# Patient Record
Sex: Female | Born: 1995 | Hispanic: No | Marital: Single | Smoking: Never smoker
Health system: Southern US, Community
[De-identification: ages and names within clinical notes are randomized; demographics above are authoritative.]

## PROBLEM LIST (undated history)

## (undated) ENCOUNTER — Inpatient Hospital Stay (HOSPITAL_COMMUNITY): Payer: Self-pay

## (undated) DIAGNOSIS — N83209 Unspecified ovarian cyst, unspecified side: Secondary | ICD-10-CM

## (undated) DIAGNOSIS — Z789 Other specified health status: Secondary | ICD-10-CM

## (undated) HISTORY — PX: WISDOM TOOTH EXTRACTION: SHX21

---
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
## 2014-06-10 ENCOUNTER — Encounter (HOSPITAL_COMMUNITY): Payer: Self-pay | Admitting: *Deleted

## 2014-07-25 ENCOUNTER — Encounter (HOSPITAL_COMMUNITY): Payer: Self-pay | Admitting: *Deleted

## 2014-09-29 IMAGING — US US OB TRANSVAGINAL
1 series · 14 of 28 positions shown · non-contrast
Comparison: None.

CLINICAL DATA: 17-year-old pregnant female with pelvic pain,
cramping and spotting. Estimated gestational age of 6 weeks 0 days
by LMP.

Quantitative beta HCG of 5,360
EXAM:
OBSTETRIC <14 WK US AND TRANSVAGINAL OB US
TECHNIQUE: Both transabdominal and transvaginal ultrasound examinations were
performed for complete evaluation of the gestation as well as the
maternal uterus, adnexal regions, and pelvic cul-de-sac.
Transvaginal technique was performed to assess early pregnancy.

[Series 1: us ob comp less 14 wks · 49 acquisitions, 14 frames shown]
[im 2/49]
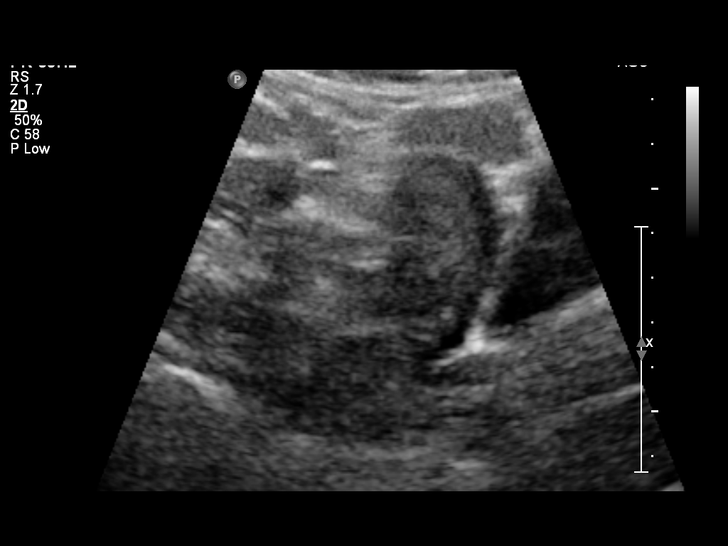
[im 6/49]
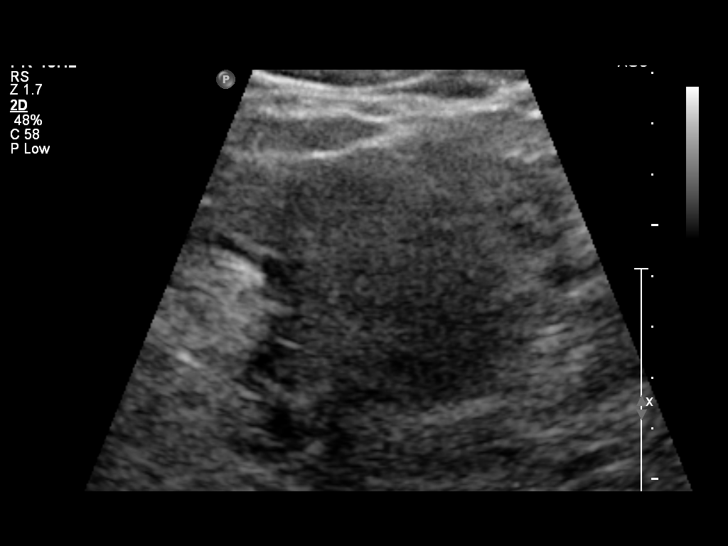
[im 9/49]
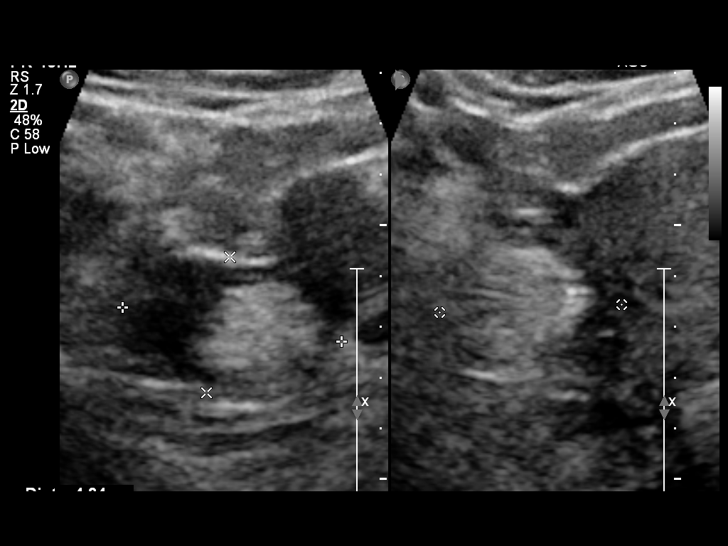
[im 13/49]
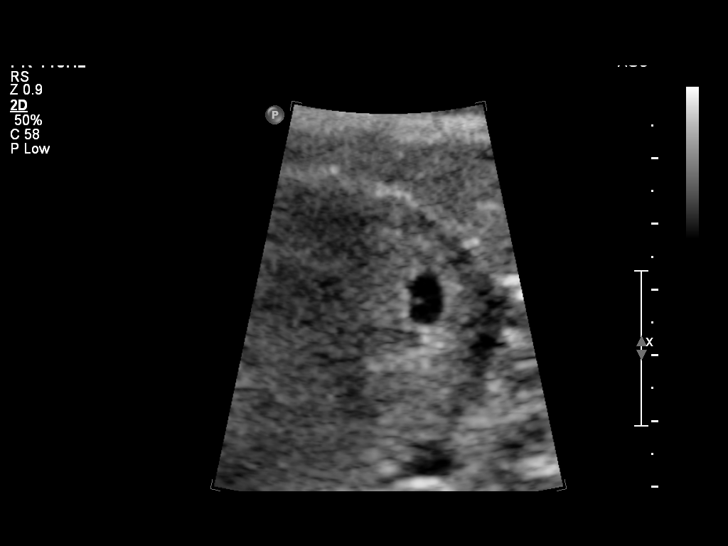
[im 17/49]
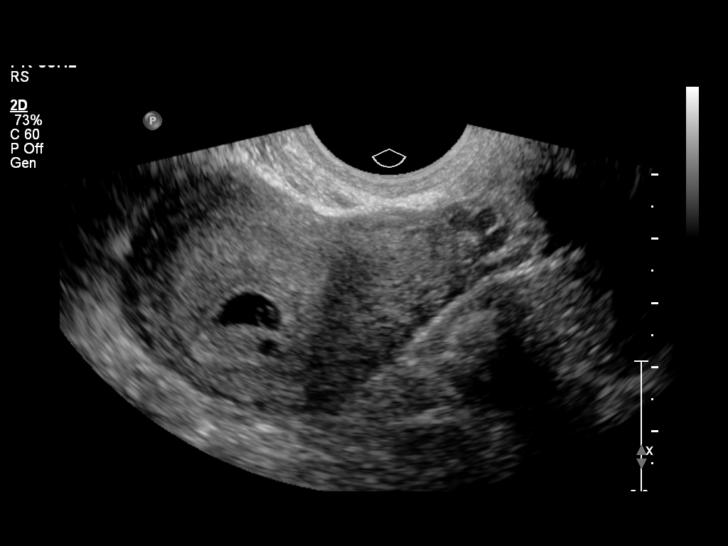
[im 20/49]
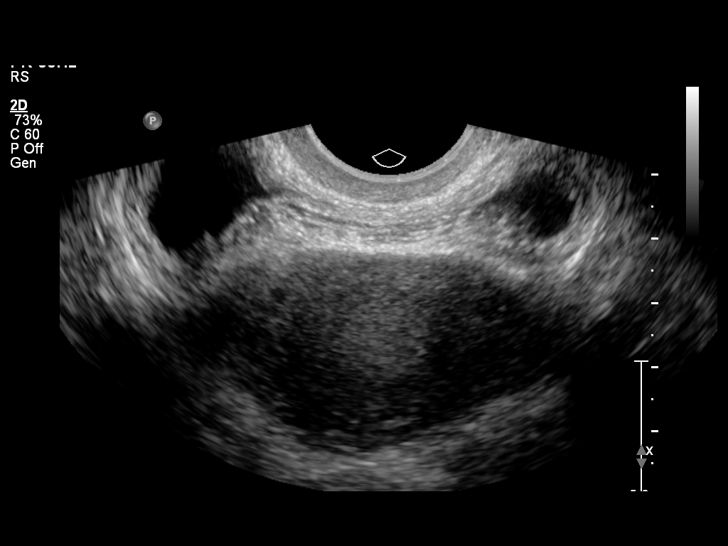
[im 24/49]
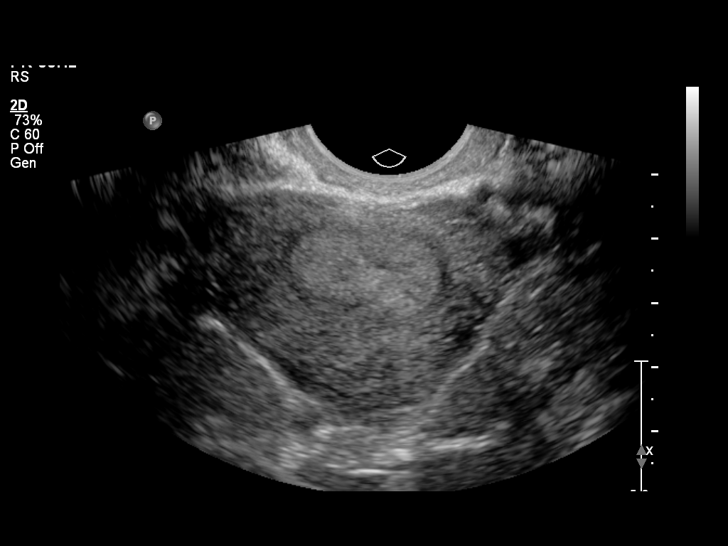
[im 27/49]
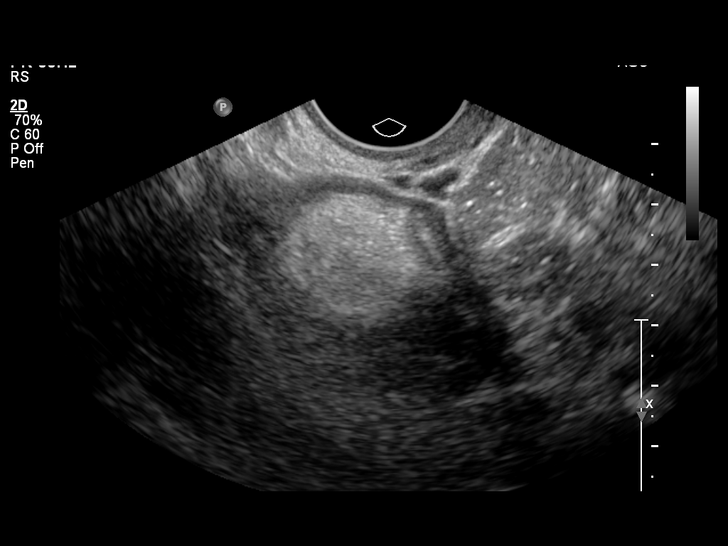
[im 31/49]
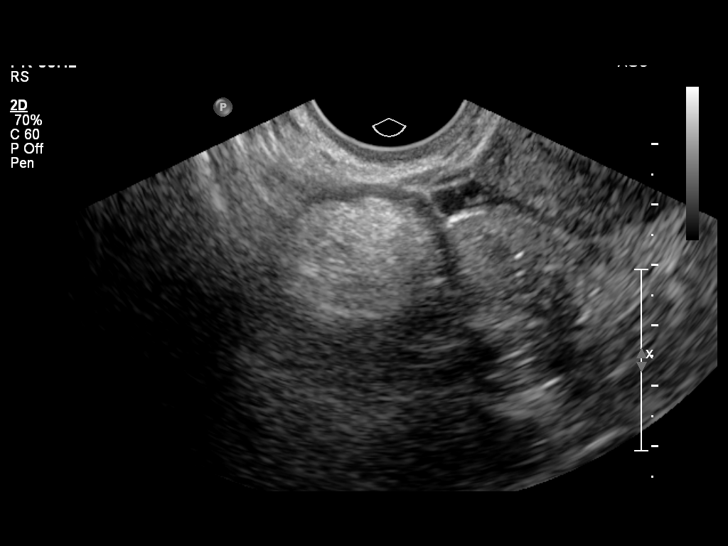
[im 34/49]
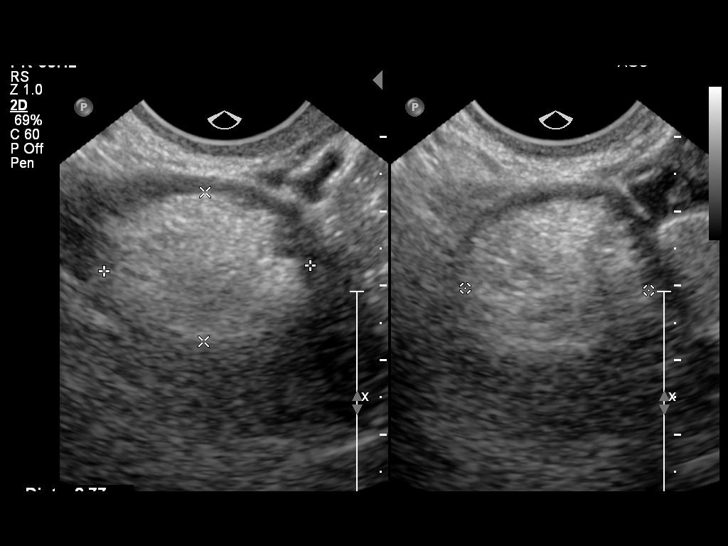
[im 38/49]
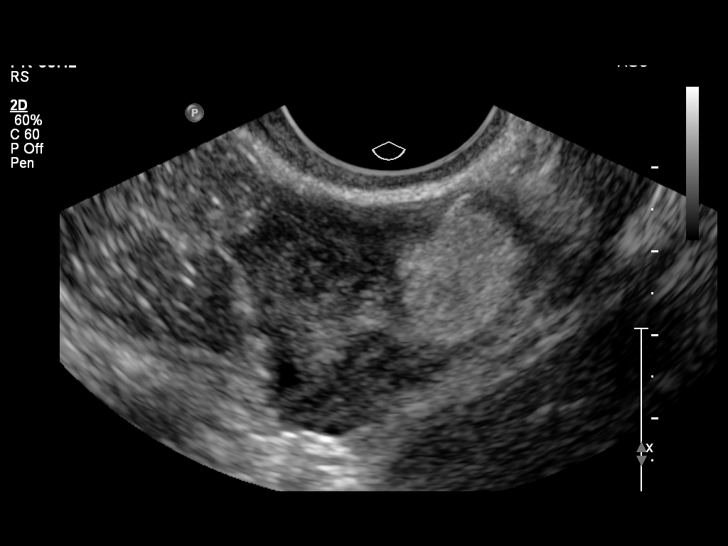
[im 41/49]
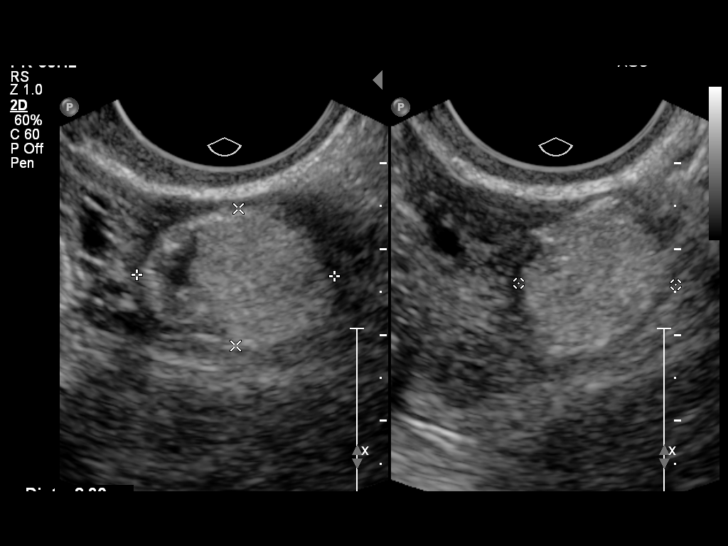
[im 45/49]
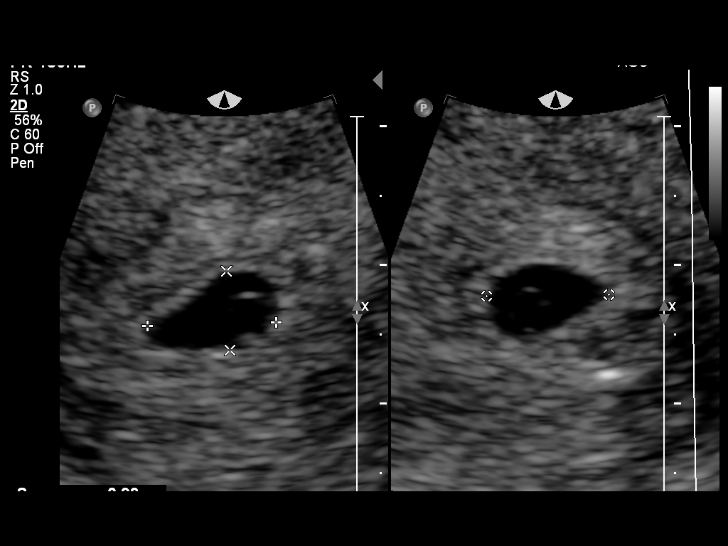
[im 49/49]
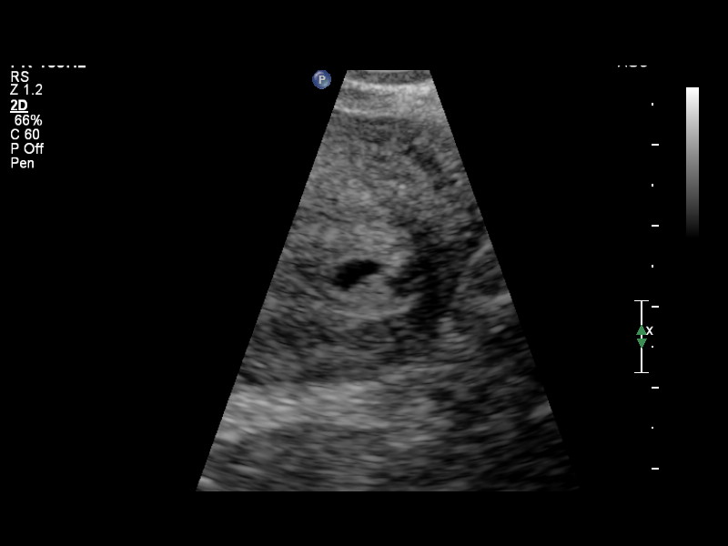

[14 of 28 positions shown; findings below may reference images not displayed]

FINDINGS: Intrauterine gestational sac: Visualized/normal in shape.

Yolk sac:  Visualized

Embryo:  Not visualized

Cardiac Activity: Not visualized

MSD:  7.9  mm   5 w   3  d           US EDC: 04/04/2014

Maternal uterus/adnexae: A small subchorionic hemorrhage is noted.

A 2.8 x 2 x 2.5 cm right ovarian dermoid is present.

A 2.3 x 1.6 x 1.8 cm left ovarian dermoid is present.

There is no evidence of free fluid or other adnexal mass.
IMPRESSION: Single intrauterine gestational sac with yolk sac. Fetal pole not
identified at this time. Estimated gestational age of 5 weeks 3 days
by this ultrasound.

Small subchorionic hemorrhage.

Bilateral ovarian dermoids.

## 2016-10-18 ENCOUNTER — Emergency Department (HOSPITAL_COMMUNITY): Payer: Self-pay

## 2016-10-18 ENCOUNTER — Encounter (HOSPITAL_COMMUNITY): Payer: Self-pay | Admitting: *Deleted

## 2016-10-18 ENCOUNTER — Emergency Department (HOSPITAL_COMMUNITY)
Admission: EM | Admit: 2016-10-18 | Discharge: 2016-10-18 | Disposition: A | Discharge status: Home / Self Care | Payer: Self-pay | Attending: Emergency Medicine | Admitting: Emergency Medicine

## 2016-10-18 DIAGNOSIS — N939 Abnormal uterine and vaginal bleeding, unspecified: Secondary | ICD-10-CM

## 2016-10-18 DIAGNOSIS — Z3A09 9 weeks gestation of pregnancy: Secondary | ICD-10-CM | POA: Insufficient documentation

## 2016-10-18 DIAGNOSIS — O23591 Infection of other part of genital tract in pregnancy, first trimester: Secondary | ICD-10-CM | POA: Insufficient documentation

## 2016-10-18 DIAGNOSIS — O0281 Inappropriate change in quantitative human chorionic gonadotropin (hCG) in early pregnancy: Secondary | ICD-10-CM | POA: Insufficient documentation

## 2016-10-18 DIAGNOSIS — N760 Acute vaginitis: Secondary | ICD-10-CM

## 2016-10-18 DIAGNOSIS — B9689 Other specified bacterial agents as the cause of diseases classified elsewhere: Secondary | ICD-10-CM

## 2016-10-18 HISTORY — DX: Unspecified ovarian cyst, unspecified side: N83.209

## 2016-10-18 LAB — WET PREP, GENITAL
SPERM: NONE SEEN
Trich, Wet Prep: NONE SEEN
YEAST WET PREP: NONE SEEN

## 2016-10-18 LAB — CBC WITH DIFFERENTIAL/PLATELET
Basophils Absolute: 0 10*3/uL (ref 0.0–0.1)
Basophils Relative: 0 %
EOS PCT: 0 %
Eosinophils Absolute: 0 10*3/uL (ref 0.0–0.7)
HEMATOCRIT: 36.5 % (ref 36.0–46.0)
HEMOGLOBIN: 12.9 g/dL (ref 12.0–15.0)
LYMPHS ABS: 1.9 10*3/uL (ref 0.7–4.0)
LYMPHS PCT: 33 %
MCH: 29.2 pg (ref 26.0–34.0)
MCHC: 35.3 g/dL (ref 30.0–36.0)
MCV: 82.6 fL (ref 78.0–100.0)
Monocytes Absolute: 0.5 10*3/uL (ref 0.1–1.0)
Monocytes Relative: 9 %
NEUTROS ABS: 3.3 10*3/uL (ref 1.7–7.7)
Neutrophils Relative %: 58 %
PLATELETS: 211 10*3/uL (ref 150–400)
RBC: 4.42 MIL/uL (ref 3.87–5.11)
RDW: 12.3 % (ref 11.5–15.5)
WBC: 5.8 10*3/uL (ref 4.0–10.5)

## 2016-10-18 LAB — URINALYSIS, ROUTINE W REFLEX MICROSCOPIC
Bilirubin Urine: NEGATIVE
GLUCOSE, UA: NEGATIVE mg/dL
HGB URINE DIPSTICK: NEGATIVE
Ketones, ur: 20 mg/dL — AB
NITRITE: NEGATIVE
PROTEIN: NEGATIVE mg/dL
SPECIFIC GRAVITY, URINE: 1.03 (ref 1.005–1.030)
pH: 6 (ref 5.0–8.0)

## 2016-10-18 LAB — COMPREHENSIVE METABOLIC PANEL
ALK PHOS: 50 U/L (ref 38–126)
ALT: 15 U/L (ref 14–54)
AST: 17 U/L (ref 15–41)
Albumin: 3.7 g/dL (ref 3.5–5.0)
Anion gap: 8 (ref 5–15)
BILIRUBIN TOTAL: 0.3 mg/dL (ref 0.3–1.2)
BUN: 6 mg/dL (ref 6–20)
CALCIUM: 9.2 mg/dL (ref 8.9–10.3)
CO2: 22 mmol/L (ref 22–32)
CREATININE: 0.5 mg/dL (ref 0.44–1.00)
Chloride: 107 mmol/L (ref 101–111)
Glucose, Bld: 84 mg/dL (ref 65–99)
Potassium: 3.5 mmol/L (ref 3.5–5.1)
Sodium: 137 mmol/L (ref 135–145)
Total Protein: 6.6 g/dL (ref 6.5–8.1)

## 2016-10-18 LAB — HCG, QUANTITATIVE, PREGNANCY: hCG, Beta Chain, Quant, S: 212918 m[IU]/mL — ABNORMAL HIGH (ref <5)

## 2016-10-18 MED ORDER — METRONIDAZOLE 500 MG PO TABS: 500.0000 mg | ORAL_TABLET | Freq: Two times a day (BID) | ORAL | 0 refills | Status: AC

## 2016-10-18 NOTE — ED Triage Notes (Signed)
Pt reports being approx [redacted] weeks pregnant and having spotting. Had similar episode 2 weeks ago. Also has foul odor and hx of BV

## 2016-10-18 NOTE — Discharge Instructions (Addendum)
Please follow up with the women's clinic on Monday for follow up.  Your ultrasound today showed: Single live 9 week 5 day intrauterine gestation with ultrasound EDC  of 05/18/2017. Small right-sided subchorionic hemorrhage. Stable  echogenic foci within the ovaries consistent with previously  described small dermoids.

## 2016-10-18 NOTE — ED Notes (Signed)
Pt to ultrasound

## 2016-10-18 NOTE — ED Provider Notes (Signed)
[facility]-EMERGENCY DEPT Provider Note   CSN: [id number] Arrival date & time: 10/18/16  1316     History   Chief Complaint Chief Complaint  Patient presents with  . Vaginal Bleeding  . Vaginal Discharge    HPI Taylor Webb is a 21 y.o. female presenting with vaginal spotting which she states only occurs when she wipes but is not enough to fill a pad or anything. She has had a little bit of spotting 2 weeks ago as well. She just recently moved to the area and doesn't have an OB/GYN. She was last seen January 5th  checked her hCG levels which were half of what they were supposed to be. She had an ultrasound on December 31st which showed an intrauterine sac but no pole. She also endorses a foul vaginal smell, nausea, vomiting and states that she has not been able to keep anything down for the last 2 weeks. She denies any abdominal pain, shortness of breath, chest pain or any other symptoms.  HPI  Past Medical History:  Diagnosis Date  . Medical history non-contributory   . Ovarian cyst     Patient Active Problem List   Diagnosis Date Noted  . Headache(784.0) 09/08/2013    Past Surgical History:  Procedure Laterality Date  . WISDOM TOOTH EXTRACTION      OB History    Gravida Para Term Preterm AB Living   2             SAB TAB Ectopic Multiple Live Births                   Home Medications    Prior to Admission medications   Medication Sig Start Date End Date Taking? Authorizing Provider  acetaminophen-codeine (TYLENOL #3) 300-30 MG per tablet Take 1-2 tablets by mouth every 6 (six) hours as needed for moderate pain. Patient not taking: Reported on 10/18/2016 10/02/13   Delbert Phenix, NP  azithromycin (ZITHROMAX Z-PAK) 250 MG tablet Take 1 tablet (250 mg total) by mouth daily. Patient not taking: Reported on 10/18/2016 10/02/13   Delbert Phenix, NP  oseltamivir (TAMIFLU) 75 MG capsule Take 1 capsule (75 mg total) by mouth every 12 (twelve) hours. Patient not taking:  Reported on 10/18/2016 10/02/13   Delbert Phenix, NP    Family History Family History  Problem Relation Age of Onset  . Depression Mother   . Hypertension Mother   . Bipolar disorder Mother   . Asthma Brother   . Asthma Daughter   . Arthritis Maternal Grandmother   . Arthritis Maternal Grandfather   . Bipolar disorder Sister     Social History Social History  Substance Use Topics  . Smoking status: Never Smoker  . Smokeless tobacco: Never Used  . Alcohol use No     Allergies   Pineapple   Review of Systems Review of Systems  Constitutional: Negative for chills and fever.  HENT: Negative for ear pain and sore throat.   Eyes: Negative for pain and visual disturbance.  Respiratory: Negative for cough, chest tightness, shortness of breath, wheezing and stridor.   Cardiovascular: Negative for chest pain, palpitations and leg swelling.  Gastrointestinal: Positive for nausea and vomiting. Negative for abdominal distention, abdominal pain, blood in stool and diarrhea.  Genitourinary: Positive for vaginal bleeding. Negative for difficulty urinating, dysuria, flank pain, hematuria, pelvic pain, vaginal discharge and vaginal pain.       Spotting with streaks of dark blood only when wiping  Musculoskeletal:  Negative for arthralgias, back pain, gait problem, myalgias, neck pain and neck stiffness.  Skin: Negative for color change, pallor and rash.  Neurological: Negative for seizures and syncope.  All other systems reviewed and are negative.    Physical Exam Updated Vital Signs BP 128/66   Pulse 75   Temp 97.8 F (36.6 C) (Oral)   Resp 18   LMP 08/07/2016   SpO2 100%   Physical Exam  Constitutional: She appears well-developed and well-nourished. No distress.  Afebrile, nontoxic-appearing, sitting comfortably in bed in no acute distress.  HENT:  Head: Normocephalic and atraumatic.  Eyes: Conjunctivae and EOM are normal.  Neck: Normal range of motion. Neck supple.    Cardiovascular: Normal rate, regular rhythm, normal heart sounds and intact distal pulses.   No murmur heard. Pulmonary/Chest: Effort normal and breath sounds normal. No respiratory distress.  Abdominal: Soft. Bowel sounds are normal. She exhibits no distension and no mass. There is no tenderness. There is no rebound and no guarding.  Genitourinary: Vaginal discharge found.  Genitourinary Comments: No adnexal tenderness or cervical motion tenderness. Small amount of dark blood from the os. Os is closed  Musculoskeletal: She exhibits no edema or tenderness.  Neurological: She is alert.  Skin: Skin is warm and dry. No rash noted. She is not diaphoretic. No pallor.  Psychiatric: She has a normal mood and affect. Her behavior is normal.  Nursing note and vitals reviewed.    ED Treatments / Results  Labs (all labs ordered are listed, but only abnormal results are displayed) Labs Reviewed  HCG, QUANTITATIVE, PREGNANCY - Abnormal; Notable for the following:       Result Value   hCG, Beta Chain, Quant, S 212,918 (*)    All other components within normal limits  URINALYSIS, ROUTINE W REFLEX MICROSCOPIC - Abnormal; Notable for the following:    APPearance HAZY (*)    Ketones, ur 20 (*)    Leukocytes, UA TRACE (*)    Bacteria, UA RARE (*)    Squamous Epithelial / LPF 0-5 (*)    All other components within normal limits  WET PREP, GENITAL  CBC WITH DIFFERENTIAL/PLATELET  COMPREHENSIVE METABOLIC PANEL  RPR  HIV ANTIBODY (ROUTINE TESTING)  GC/CHLAMYDIA PROBE AMP ([place]) NOT AT [facility]    EKG  EKG Interpretation None       Radiology US Ob Comp Less 14 Wks  Result Date: 10/18/2016 CLINICAL DATA:  Vaginal bleeding EXAM: OBSTETRIC <14 WK [place] AND TRANSVAGINAL OB US TECHNIQUE: Both transabdominal and transvaginal ultrasound examinations were performed for complete evaluation of the gestation as well as the maternal uterus, adnexal regions, and pelvic cul-de-sac. Transvaginal technique  was performed to assess early pregnancy. COMPARISON:  None for this pregnancy FINDINGS: Intrauterine gestational sac: Single Yolk sac:  Visualized. Embryo:  Visualized. Cardiac Activity: Visualized. Heart Rate: 171  bpm CRL:  29  mm   9 w   5 d                  [place] EDC: 05/18/2017 Subchorionic hemorrhage:  Small noted on the right Maternal uterus/adnexae: Nonacute. Areas of increased echogenicity within both ovaries consistent with dermoids are without significant change. IMPRESSION: Single live 9 week 5 day intrauterine gestation with ultrasound EDC of 05/18/2017. Small right-sided subchorionic hemorrhage. Stable echogenic foci within the ovaries consistent with previously described small dermoids. Electronically Signed   By: Tollie Eth M.D.   On: 10/18/2016 18:25   US Ob Transvaginal  Result Date: 10/18/2016  CLINICAL DATA:  Vaginal bleeding EXAM: OBSTETRIC <14 WK US AND TRANSVAGINAL OB US TECHNIQUE: Both transabdominal and transvaginal ultrasound examinations were performed for complete evaluation of the gestation as well as the maternal uterus, adnexal regions, and pelvic cul-de-sac. Transvaginal technique was performed to assess early pregnancy. COMPARISON:  None for this pregnancy FINDINGS: Intrauterine gestational sac: Single Yolk sac:  Visualized. Embryo:  Visualized. Cardiac Activity: Visualized. Heart Rate: 171  bpm CRL:  29  mm   9 w   5 d                  US EDC: 05/18/2017 Subchorionic hemorrhage:  Small noted on the right Maternal uterus/adnexae: Nonacute. Areas of increased echogenicity within both ovaries consistent with dermoids are without significant change. IMPRESSION: Single live 9 week 5 day intrauterine gestation with ultrasound EDC of 05/18/2017. Small right-sided subchorionic hemorrhage. Stable echogenic foci within the ovaries consistent with previously described small dermoids. Electronically Signed   By: Tollie Ethavid  Kwon M.D.   On: 10/18/2016 18:25    Procedures Procedures (including  critical care time)  Medications Ordered in ED Medications - No data to display   Initial Impression / Assessment and Plan / ED Course  I have reviewed the triage vital signs and the nursing notes.  Pertinent labs & imaging results that were available during my care of the patient were reviewed by me and considered in my medical decision making (see chart for details).    21 y/o otherwise healthy presenting with vaginal spotting in first trimester. Patient does not have an OB/Gyn and states that she had an ultrasound on December 31st showing an intrauterine gestational sac but too early for fetal pole visualization. LMP 08/07/2016  U/s here showing fetal pole and cardiac activity. 9 weeks and 5 days. EDD 05/18/2017 Pelvic exam showed closed os, small amount of dark blood from the os and thick vaginal discharge and odor. Overall exam otherwise unremarkable.  Discussed with patient need to follow up with the women's clinic on Monday and thereafter transfer her prenatal care to her OB/Gyn once insurance is effective. Emphasized the importance of prenatal care. She understood and agreed with plan.  Discussed strict return precautions. Patient was advised to return to the emergency department if experiencing any worsening of symptoms. She understood instructions and agreed with discharge plan.  Transferred patient care at end of shift to Lajuana RippleShari Upstil, PA-C pending wet prep results. Anticipate discharge home +/- script.  Patient was discussed with Dr. Donnald GarrePfeiffer who agrees with assessment and plan.  Final Clinical Impressions(s) / ED Diagnoses   Final diagnoses:  Vaginal spotting    New Prescriptions New Prescriptions   No medications on file     Gregary CromerJessica B Tanikka Bresnan, PA-C 10/18/16 2112    Arby BarretteMarcy Pfeiffer, MD 10/21/16 [phone]

## 2016-10-18 NOTE — ED Provider Notes (Signed)
Patient signed out at end of shift by Mathews RobinsonsJessica Mitchell, PA-C, with wet prep pending. She presented for vaginal spotting in the setting of known pregnancy. She had a previous US confirming IUP, per patient, but was concerned about recent vaginal spotting.   Wet prep shows BV which will be treated with Flagyl. [facility]. Return precautions were reviewed.    Elpidio AnisShari Keelon Zurn, PA-C 10/18/16 2217    Arby BarretteMarcy Pfeiffer, MD 10/21/16 [phone]

## 2016-10-19 LAB — HIV ANTIBODY (ROUTINE TESTING W REFLEX): HIV Screen 4th Generation wRfx: NONREACTIVE

## 2016-10-19 LAB — RPR: RPR: NONREACTIVE

## 2016-10-22 LAB — GC/CHLAMYDIA PROBE AMP (~~LOC~~) NOT AT ARMC
CHLAMYDIA, DNA PROBE: NEGATIVE
NEISSERIA GONORRHEA: NEGATIVE

## 2018-12-16 IMAGING — US US OB COMP LESS 14 WK
1 series · 14 of 28 positions shown · non-contrast
Comparison: None for this pregnancy

CLINICAL DATA: Vaginal bleeding

EXAM:
OBSTETRIC <14 WK US AND TRANSVAGINAL OB US
TECHNIQUE: Both transabdominal and transvaginal ultrasound examinations were
performed for complete evaluation of the gestation as well as the
maternal uterus, adnexal regions, and pelvic cul-de-sac.
Transvaginal technique was performed to assess early pregnancy.

[Series 1: us ob comp less 14 wk · 0.23mm/px · 14 of 89 slices shown]
[im 4/89]
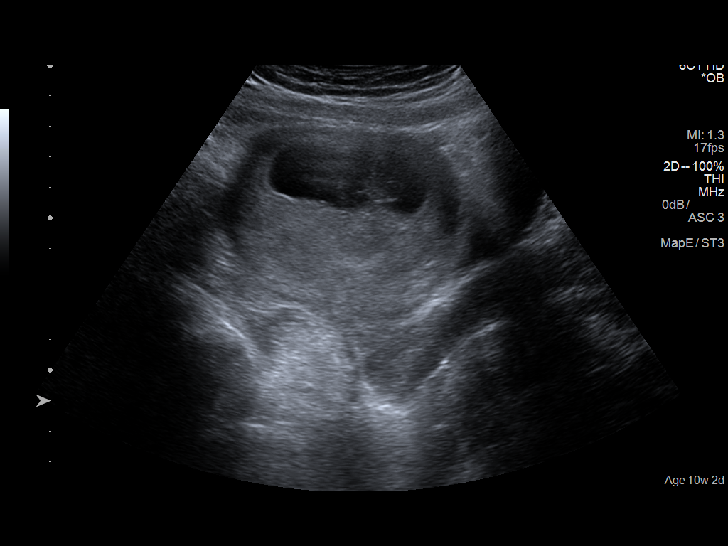
[im 10/89]
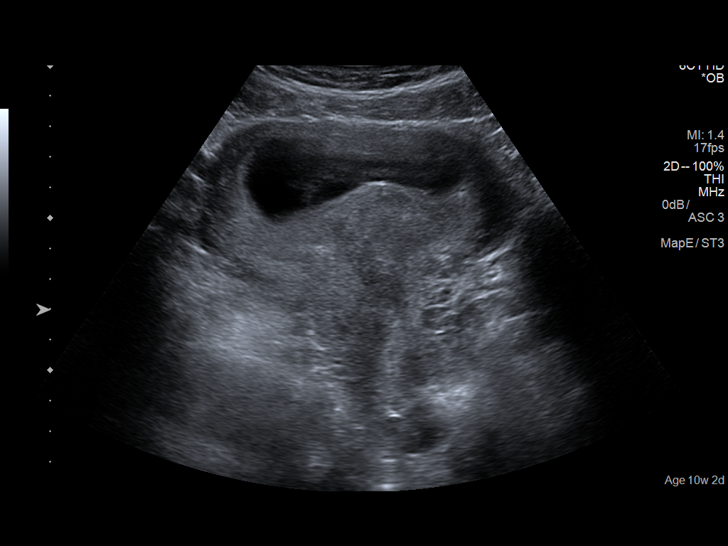
[im 17/89]
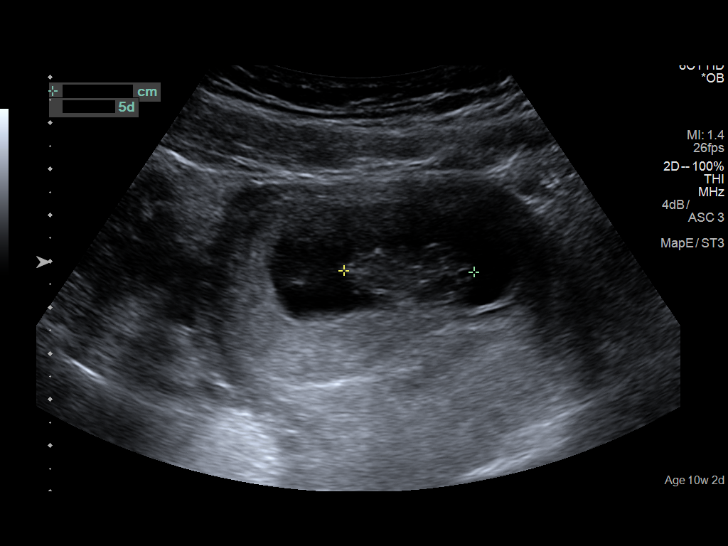
[im 23/89]
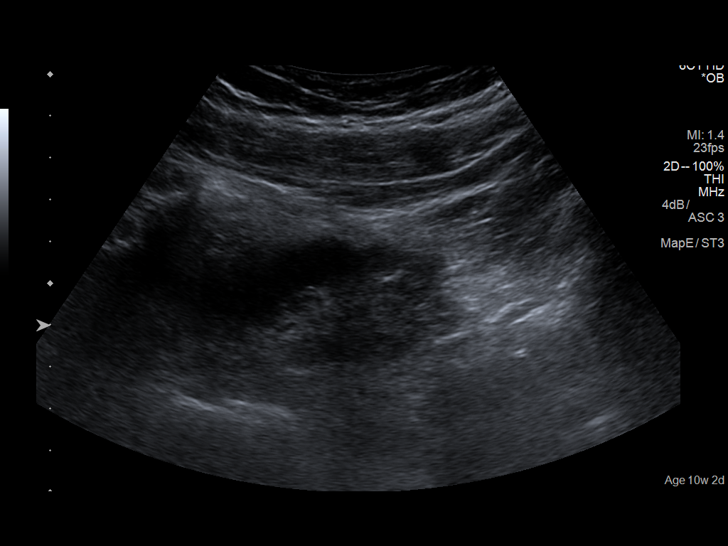
[im 30/89]
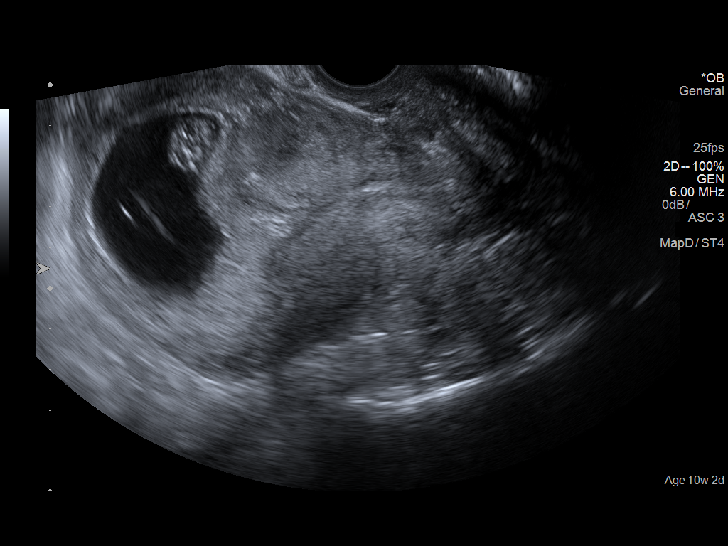
[im 36/89]
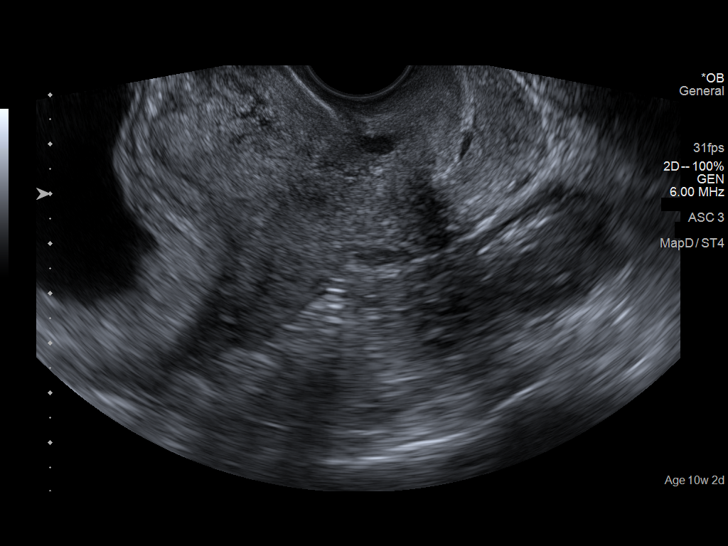
[im 43/89]
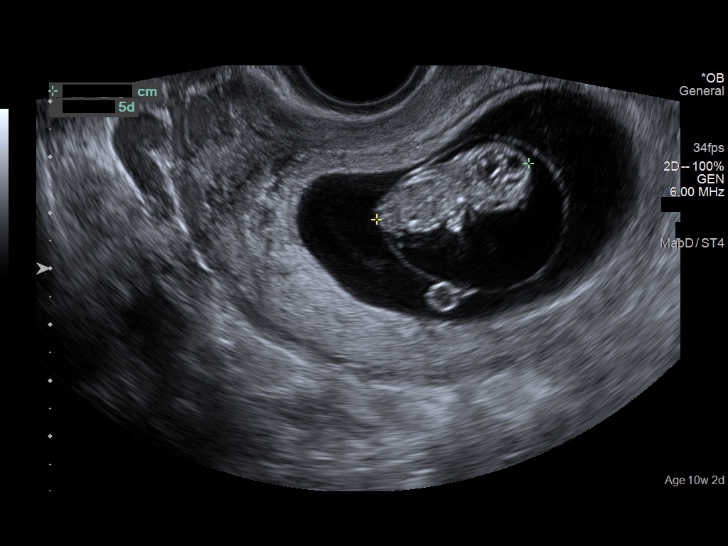
[im 49/89]
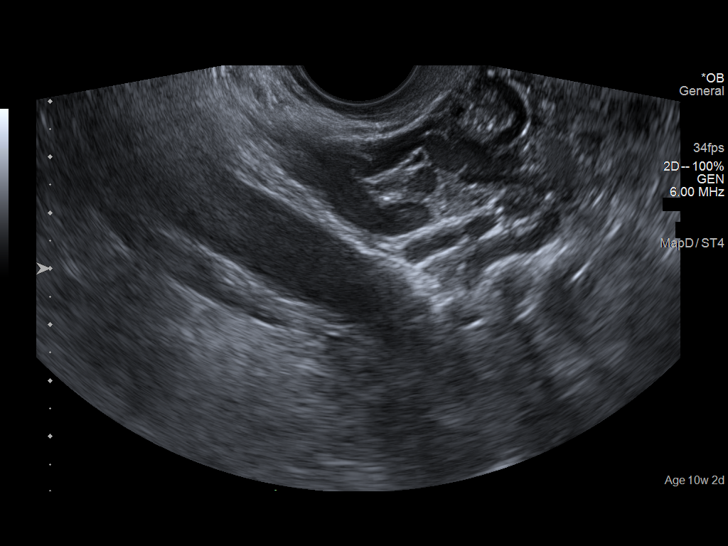
[im 56/89]
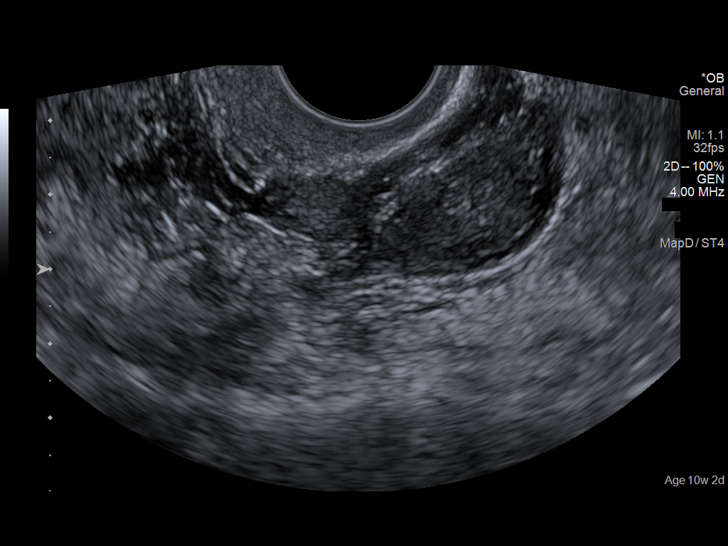
[im 62/89]
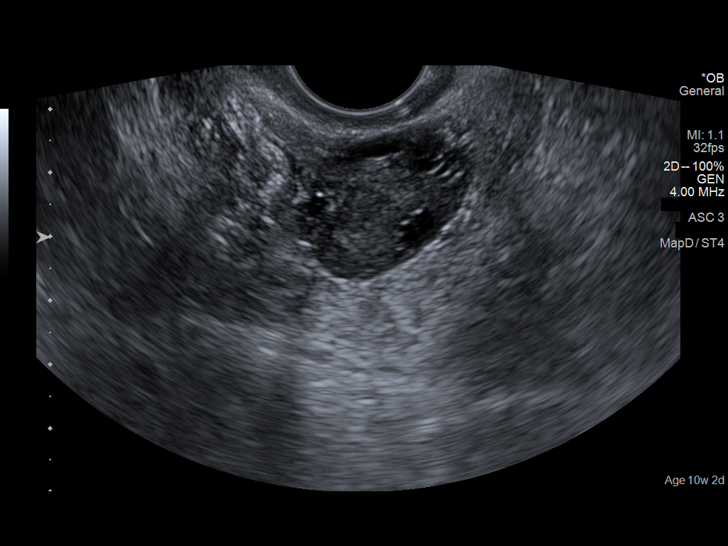
[im 69/89]
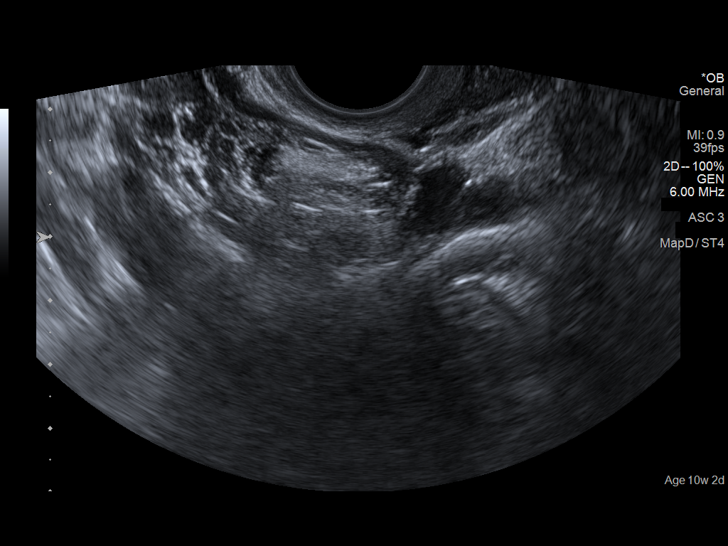
[im 75/89]
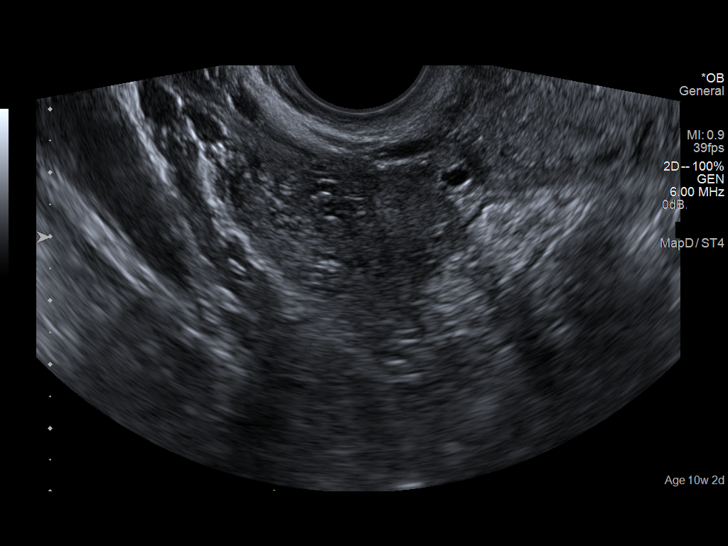
[im 82/89]
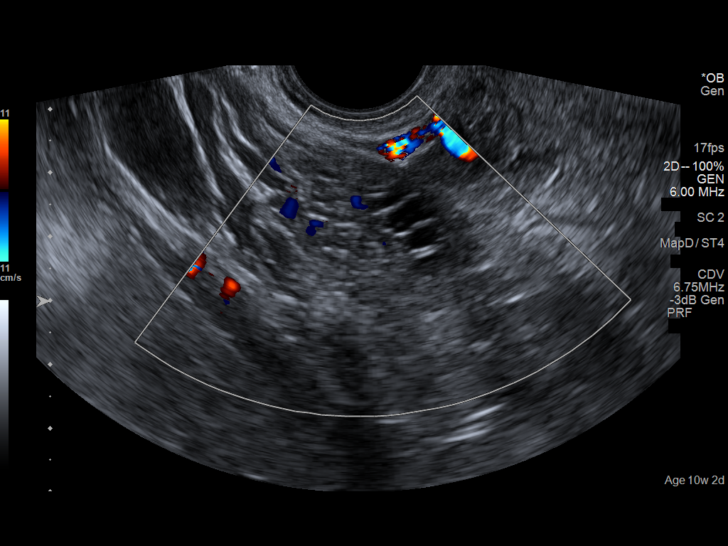
[im 89/89]
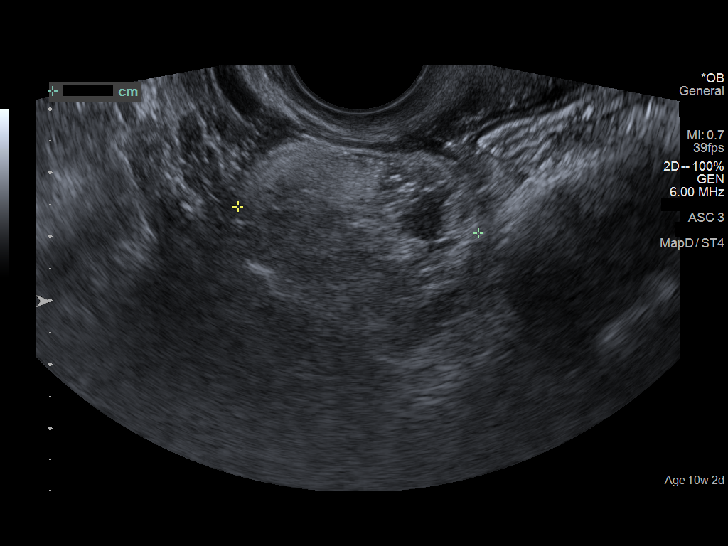

[14 of 28 positions shown; findings below may reference images not displayed]

FINDINGS: Intrauterine gestational sac: Single

Yolk sac:  Visualized.

Embryo:  Visualized.

Cardiac Activity: Visualized.

Heart Rate: 171  bpm

CRL:  29  mm   9 w   5 d                  US EDC: 05/18/2017

Subchorionic hemorrhage:  Small noted on the right

Maternal uterus/adnexae: Nonacute. Areas of increased echogenicity
within both ovaries consistent with dermoids are without significant
change.
IMPRESSION: Single live 9 week 5 day intrauterine gestation with ultrasound EDC
of 05/18/2017. Small right-sided subchorionic hemorrhage. Stable
echogenic foci within the ovaries consistent with previously
described small dermoids.
